# Patient Record
Sex: Male | Born: 1980 | Race: Black or African American | Hispanic: No | Marital: Single | Smoking: Former smoker
Health system: Southern US, Community
[De-identification: ages and names within clinical notes are randomized; demographics above are authoritative.]

## PROBLEM LIST (undated history)

## (undated) DIAGNOSIS — I10 Essential (primary) hypertension: Secondary | ICD-10-CM

---

## 2001-05-01 ENCOUNTER — Emergency Department (HOSPITAL_COMMUNITY): Admission: EM | Admit: 2001-05-01 | Discharge: 2001-05-02 | Payer: Self-pay

## 2001-05-02 ENCOUNTER — Encounter: Payer: Self-pay | Admitting: Emergency Medicine

## 2002-03-24 ENCOUNTER — Emergency Department (HOSPITAL_COMMUNITY): Admission: EM | Admit: 2002-03-24 | Discharge: 2002-03-24 | Payer: Self-pay | Admitting: Emergency Medicine

## 2002-04-03 ENCOUNTER — Emergency Department (HOSPITAL_COMMUNITY): Admission: EM | Admit: 2002-04-03 | Discharge: 2002-04-03 | Payer: Self-pay | Admitting: Emergency Medicine

## 2004-02-05 ENCOUNTER — Emergency Department (HOSPITAL_COMMUNITY): Admission: EM | Admit: 2004-02-05 | Discharge: 2004-02-05 | Payer: Self-pay | Admitting: Emergency Medicine

## 2012-02-10 ENCOUNTER — Ambulatory Visit: Payer: Self-pay | Admitting: Family Medicine

## 2012-02-10 VITALS — BP 174/112 | HR 99 | Temp 99.0°F | Resp 16 | Ht 71.0 in | Wt 204.0 lb

## 2012-02-10 DIAGNOSIS — I10 Essential (primary) hypertension: Secondary | ICD-10-CM

## 2012-02-10 DIAGNOSIS — F1411 Cocaine abuse, in remission: Secondary | ICD-10-CM

## 2012-02-10 MED ORDER — HYDROCHLOROTHIAZIDE 25 MG PO TABS: 25.0000 mg | ORAL_TABLET | Freq: Every day | ORAL | Status: DC

## 2012-02-10 NOTE — Patient Instructions (Addendum)
Very nice to meet you. Please come back Friday morning and have your blood pressure check again with Dr. Neva Seat. We do open up at 7:30 AM. We will be getting labs today and we will discuss this on Friday morning. I'm also going to start you a medicine called hydrochlorothiazide. Take one pill daily If he started getting chest pain, shortness of breath, or any weakness in her extremities please seek medical attention.  Hypertension As your heart beats, it forces blood through your arteries. This force is your blood pressure. If the pressure is too high, it is called hypertension (HTN) or high blood pressure. HTN is dangerous because you may have it and not know it. High blood pressure may mean that your heart has to work harder to pump blood. Your arteries may be narrow or stiff. The extra work puts you at risk for heart disease, stroke, and other problems.  Blood pressure consists of two numbers, a higher number over a lower, 110/72, for example. It is stated as "110 over 72." The ideal is below 120 for the top number (systolic) and under 80 for the bottom (diastolic). Write down your blood pressure today. You should pay close attention to your blood pressure if you have certain conditions such as:  Heart failure.  Prior heart attack.  Diabetes  Chronic kidney disease.  Prior stroke.  Multiple risk factors for heart disease. To see if you have HTN, your blood pressure should be measured while you are seated with your arm held at the level of the heart. It should be measured at least twice. A one-time elevated blood pressure reading (especially in the Emergency Department) does not mean that you need treatment. There may be conditions in which the blood pressure is different between your right and left arms. It is important to see your caregiver soon for a recheck. Most people have essential hypertension which means that there is not a specific cause. This type of high blood pressure may be  lowered by changing lifestyle factors such as:  Stress.  Smoking.  Lack of exercise.  Excessive weight.  Drug/tobacco/alcohol use.  Eating less salt. Most people do not have symptoms from high blood pressure until it has caused damage to the body. Effective treatment can often prevent, delay or reduce that damage. TREATMENT  When a cause has been identified, treatment for high blood pressure is directed at the cause. There are a large number of medications to treat HTN. These fall into several categories, and your caregiver will help you select the medicines that are best for you. Medications may have side effects. You should review side effects with your caregiver. If your blood pressure stays high after you have made lifestyle changes or started on medicines,   Your medication(s) may need to be changed.  Other problems may need to be addressed.  Be certain you understand your prescriptions, and know how and when to take your medicine.  Be sure to follow up with your caregiver within the time frame advised (usually within two weeks) to have your blood pressure rechecked and to review your medications.  If you are taking more than one medicine to lower your blood pressure, make sure you know how and at what times they should be taken. Taking two medicines at the same time can result in blood pressure that is too low. SEEK IMMEDIATE MEDICAL CARE IF:  You develop a severe headache, blurred or changing vision, or confusion.  You have unusual weakness or numbness,  or a faint feeling.  You have severe chest or abdominal pain, vomiting, or breathing problems. MAKE SURE YOU:   Understand these instructions.  Will watch your condition.  Will get help right away if you are not doing well or get worse. Document Released: 12/22/2004 Document Revised: 03/16/2011 Document Reviewed: 08/12/2007 [facility] Patient Information 2013 [place], [place].

## 2012-02-10 NOTE — Progress Notes (Signed)
Patient is a 32 year old male who is coming in with complaint of erectile dysfunction. Patient states that this has been a on and off phenomenon over the course of last year. Patient denies any other recent illnesses and has not been diagnosed with any other trouble. She would like to discuss this further but on intake patient was found to have a blood pressure of 212/120. Patient denies any headache, any visual changes or any abnormal weight loss or gain. Patient has not changed his diet recently. Patient has had some mild recreational drugs with cocaine for greater than 72 hours ago. Patient states that this is not a common f practice. Patient denies any abdominal pain any changes in urination or bowel movements.  Physical exam BP 174/112  Pulse 99  Temp 99 F (37.2 C) (Oral)  Resp 16  Ht 5\' 11"  (1.803 m)  Wt 204 lb (92.534 kg)  BMI 28.45 kg/m2  SpO2 99% General appearance: alert, cooperative and appears stated age Eyes: conjunctivae/corneas clear. PERRL, EOM's intact. Fundi benign. Throat: lips, mucosa, and tongue normal; teeth and gums normal Lungs: clear to auscultation bilaterally Chest wall: no tenderness Heart: regular rate and rhythm, S1, S2 normal, no murmur, click, rub or gallop Abdomen: soft, non-tender; bowel sounds normal; no masses,  no organomegaly Extremities: extremities normal, atraumatic, no cyanosis or edema Pulses: 2+ and symmetric Skin: Skin color, texture, turgor normal. No rashes or lesions Neurologic: Grossly normal  Assessment: Hypertension  Plan: Patient did have a complete metabolic panel drawn today. If this is normal further workup will be necessary including an EKG, TSH, and cortisol levels. Patient will be following up in 48 hours with Dr. Chilton Si for blood pressure check. Patient was placed on hydrochlorothiazide 25 mg today. Patient did understand red flags and when to seek medical attention.

## 2012-02-11 LAB — COMPREHENSIVE METABOLIC PANEL
ALT: <8 U/L (ref 0–53)
AST: 8 U/L (ref 0–37)
Albumin: 4.1 g/dL (ref 3.5–5.2)
Alkaline Phosphatase: 42 U/L (ref 39–117)
BUN: 11 mg/dL (ref 6–23)
CO2: 25 mEq/L (ref 19–32)
Calcium: 9 mg/dL (ref 8.4–10.5)
Chloride: 106 mEq/L (ref 96–112)
Creat: 1.24 mg/dL (ref 0.50–1.35)
Glucose, Bld: 83 mg/dL (ref 70–99)
Potassium: 3.6 mEq/L (ref 3.5–5.3)
Sodium: 139 mEq/L (ref 135–145)
Total Bilirubin: 0.6 mg/dL (ref 0.3–1.2)
Total Protein: 7.5 g/dL (ref 6.0–8.3)

## 2012-02-12 ENCOUNTER — Ambulatory Visit: Payer: Self-pay | Admitting: Family Medicine

## 2012-02-12 VITALS — BP 180/98 | HR 80 | Temp 98.5°F | Resp 16 | Ht 70.0 in | Wt 202.8 lb

## 2012-02-12 DIAGNOSIS — I10 Essential (primary) hypertension: Secondary | ICD-10-CM

## 2012-02-12 DIAGNOSIS — R809 Proteinuria, unspecified: Secondary | ICD-10-CM

## 2012-02-12 DIAGNOSIS — F1410 Cocaine abuse, uncomplicated: Secondary | ICD-10-CM

## 2012-02-12 LAB — POCT URINALYSIS DIPSTICK
Bilirubin, UA: NEGATIVE
Glucose, UA: NEGATIVE
Leukocytes, UA: NEGATIVE
Nitrite, UA: NEGATIVE
Protein, UA: 100
Spec Grav, UA: 1.025
Urobilinogen, UA: 0.2
pH, UA: 5.5

## 2012-02-12 NOTE — Progress Notes (Signed)
Subjective:    Patient ID: Jeffrey Baxter, male    DOB: 09-29-1980, 32 y.o.   MRN: [id number]  HPI  Jeffrey Baxter is a 32 y.o. male Seen by Dr. Katrinka Blazing 2 days ago - found to have a blood pressure of 212/120. Patient denied any headache, any visual changes or any abnormal weight loss or gain. Patient had not changed his diet recently. Patient has had some mild recreational drugs with cocaine for greater than 72 hours prior.   Started on HCTZ 25mg  qd. Labs drawn: Results for orders placed in visit on 02/10/12  COMPREHENSIVE METABOLIC PANEL      Component Value Range   Sodium 139  135 - 145 mEq/L   Potassium 3.6  3.5 - 5.3 mEq/L   Chloride 106  96 - 112 mEq/L   CO2 25  19 - 32 mEq/L   Glucose, Bld 83  70 - 99 mg/dL   BUN 11  6 - 23 mg/dL   Creat 4.09  8.11 - 9.14 mg/dL   Total Bilirubin 0.6  0.3 - 1.2 mg/dL   Alkaline Phosphatase 42  39 - 117 U/L   AST 8  0 - 37 U/L   ALT <8  0 - 53 U/L   Total Protein 7.5  6.0 - 8.3 g/dL   Albumin 4.1  3.5 - 5.2 g/dL   Calcium 9.0  8.4 - 78.2 mg/dL   Has not started blood pressure medicine yet.  Thought was going to get that today. No outside Bp's.  No chest pain. No headache, no vision changes. Urinating normally.  No n/v.  No nosebleeds.   Did admit to cocaine use about 1/2gram 5 days ago during the [employer].  Uses cocaine 1-2 times per month. Plans on quitting at this point.  Does not feel like he needs assistance to quit.   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 Rare marijuana, cocaine as above. Denies other IDU.  Etoh: 1-2 beers per night during the week. 4-5 at a time on the weekend. DUI in 2003.   Quit tobacco 3 months ago.   FH - dad with htn.   Review of Systems  Constitutional: Negative for fatigue and unexpected weight change.  Eyes: Negative for visual disturbance.  Respiratory: Negative for cough, chest tightness and shortness of breath.   Cardiovascular: Negative for chest pain, palpitations and leg swelling.   Gastrointestinal: Negative for abdominal pain and blood in stool.  Genitourinary: Negative for difficulty urinating.  Neurological: Negative for dizziness, weakness, light-headedness and headaches.       Objective:   Physical Exam  Vitals reviewed. Constitutional: He is oriented to person, place, and time. He appears well-developed and well-nourished.  HENT:  Head: Normocephalic and atraumatic.  Eyes: EOM are normal. Pupils are equal, round, and reactive to light.  Neck: No JVD present. Carotid bruit is not present.  Cardiovascular: Normal rate, regular rhythm and normal heart sounds.   No murmur heard. Pulmonary/Chest: Effort normal and breath sounds normal. He has no rales.  Musculoskeletal: He exhibits no edema.  Neurological: He is alert and oriented to person, place, and time.  Skin: Skin is warm and dry.  Psychiatric: He has a normal mood and affect.   Results for orders placed in visit on 02/12/12  POCT URINALYSIS DIPSTICK      Component Value Range   Color, UA amber     Clarity, UA clear     Glucose, UA neg  Bilirubin, UA neg     Ketones, UA trace     Spec Grav, UA 1.025     Blood, UA trace     pH, UA 5.5     Protein, UA 100     Urobilinogen, UA 0.2     Nitrite, UA neg     Leukocytes, UA Negative        Assessment & Plan:  Jeffrey Baxter is a 32 y.o. male  1. Hypertension  POCT urinalysis dipstick, Ambulatory referral to Nephrology  2. Cocaine abuse  POCT urinalysis dipstick  3. Proteinuria  Ambulatory referral to Nephrology   Marked HTN, asx.  liklely in part due to prior cocaine use, but off this for 5 days, and plans on complete abstinence. Proteinuric - will refer to nephrology for eval. Can start hctz as rx last ov, and keep record of outside bp's.  Recheck 1 week, rtc/ER and orthostatic precautions reviewed.   Patient Instructions  Complete abstinence/avoidance of cocaine and minimize alcohol use - no more than 1 to 2 drinks per day.  Start the  blood pressure medicine as prescribed last office visit, and recheck in next week to 10 days.  Return to the clinic or go to the nearest emergency room if any of your symptoms worsen or new symptoms occur. Keep a record of your blood pressures outside of the office and bring them to the next office visit. We will also refer you to a kidney specialist to determine if further testing needed due to the level of your high blood pressure and the protein in the urine.

## 2012-02-12 NOTE — Patient Instructions (Addendum)
Complete abstinence/avoidance of cocaine and minimize alcohol use - no more than 1 to 2 drinks per day.  Start the blood pressure medicine as prescribed last office visit, and recheck in next week to 10 days.  Return to the clinic or go to the nearest emergency room if any of your symptoms worsen or new symptoms occur. Keep a record of your blood pressures outside of the office and bring them to the next office visit. We will also refer you to a kidney specialist to determine if further testing needed due to the level of your high blood pressure and the protein in the urine.

## 2012-02-12 NOTE — Progress Notes (Signed)
No available appts on the 17th. Per provider I called pt and he will walk in to see Dr. Neva Seat on 2/19 after work.

## 2012-02-24 ENCOUNTER — Ambulatory Visit: Payer: Self-pay | Admitting: Family Medicine

## 2012-02-24 VITALS — BP 152/88 | HR 90 | Temp 98.7°F | Resp 16 | Ht 70.0 in | Wt 197.0 lb

## 2012-02-24 DIAGNOSIS — I10 Essential (primary) hypertension: Secondary | ICD-10-CM

## 2012-02-24 DIAGNOSIS — R809 Proteinuria, unspecified: Secondary | ICD-10-CM

## 2012-02-24 MED ORDER — HYDROCHLOROTHIAZIDE 25 MG PO TABS: 25.0000 mg | ORAL_TABLET | Freq: Every day | ORAL | Status: DC

## 2012-02-24 NOTE — Progress Notes (Signed)
  Subjective:    Patient ID: Jeffrey Baxter, male    DOB: Nov 11, 1980, 32 y.o.   MRN: [id number]  HPI Jeffrey Baxter is a 32 y.o. male  See prior ov's - recent diagnosis of HTN, hx of caocaine abuse. And proteinuria noted at last ov.  Just started hctz 25mg  wd at last ov. Referred to nephrology.   Taking hctz each day. Only 1 missed dose since 02/12/12 ov. Home reading down to 125/80.  Has been exercising some. Cr wnl (1.24) at initial ov.  No further cocaine use- no cravings or addiction prior.   Results for orders placed in visit on 02/12/12  POCT URINALYSIS DIPSTICK      Result Value Range   Color, UA amber     Clarity, UA clear     Glucose, UA neg     Bilirubin, UA neg     Ketones, UA trace     Spec Grav, UA 1.025     Blood, UA trace     pH, UA 5.5     Protein, UA 100     Urobilinogen, UA 0.2     Nitrite, UA neg     Leukocytes, UA Negative       Review of Systems  Constitutional: Negative for fatigue and unexpected weight change.  Eyes: Negative for visual disturbance.  Respiratory: Negative for cough (slight coough last week with URI. no PND, no DOE, no orthopnea. ), chest tightness and shortness of breath.   Cardiovascular: Negative for chest pain, palpitations and leg swelling.  Gastrointestinal: Negative for abdominal pain and blood in stool.  Neurological: Negative for dizziness, light-headedness and headaches.       Objective:   Physical Exam  Vitals reviewed. Constitutional: He is oriented to person, place, and time. He appears well-developed and well-nourished.  HENT:  Head: Normocephalic and atraumatic.  Eyes: EOM are normal. Pupils are equal, round, and reactive to light.  Neck: No JVD present. Carotid bruit is not present.  Cardiovascular: Normal rate, regular rhythm and normal heart sounds.   No murmur heard. Pulmonary/Chest: Effort normal and breath sounds normal. He has no rales.  Musculoskeletal: He exhibits no edema.  Neurological: He is alert and  oriented to person, place, and time.  Skin: Skin is warm and dry.  Psychiatric: He has a normal mood and affect.          Assessment & Plan:  Jeffrey Baxter is a 32 y.o. male HTN (hypertension) - Plan: hydrochlorothiazide (HYDRODIURIL) 25 MG tablet - continue same dose, and recheck in next 6 weeks. Congratulated on efforts with diet and exercise.  Discussed ED meds, but as only on meds few weeks, and risk of hypotension - will wait for few more weeks of being on med to decide on safety of using this medicine.   Proteinuria - will be scheduled with nephrology in 6-8 weeks.   Patient Instructions  Continue your same dose of medicine and diet changes.  Keep up the good work. Recheck in 6 weeks. Keep a record of your blood pressures outside of the office and bring them to the next office visit. Return to the clinic or go to the nearest emergency room if any of your symptoms worsen or new symptoms occur.

## 2012-02-24 NOTE — Patient Instructions (Signed)
Continue your same dose of medicine and diet changes.  Keep up the good work. Recheck in 6 weeks. Keep a record of your blood pressures outside of the office and bring them to the next office visit. Return to the clinic or go to the nearest emergency room if any of your symptoms worsen or new symptoms occur.

## 2012-04-05 ENCOUNTER — Ambulatory Visit: Payer: Self-pay | Admitting: Family Medicine

## 2012-04-05 VITALS — BP 128/72 | HR 79 | Temp 98.8°F | Resp 18 | Wt 203.0 lb

## 2012-04-05 DIAGNOSIS — N529 Male erectile dysfunction, unspecified: Secondary | ICD-10-CM

## 2012-04-05 DIAGNOSIS — I10 Essential (primary) hypertension: Secondary | ICD-10-CM

## 2012-04-05 MED ORDER — SILDENAFIL CITRATE 100 MG PO TABS: 50.0000 mg | ORAL_TABLET | Freq: Every day | ORAL | Status: DC | PRN

## 2012-04-05 MED ORDER — HYDROCHLOROTHIAZIDE 25 MG PO TABS: 25.0000 mg | ORAL_TABLET | Freq: Every day | ORAL | Status: DC

## 2012-04-05 NOTE — Patient Instructions (Addendum)
recheck in next 3 months for blood pressure and to check testosterone level (morning lab testing is best for this). Return to the clinic or go to the nearest emergency room if any of your symptoms worsen or new symptoms occur.

## 2012-04-05 NOTE — Progress Notes (Signed)
  Subjective:    Patient ID: Jeffrey Baxter, male    DOB: [birth year], 32 y.o.   MRN: [id number]  HPI Jeffrey Baxter is a 32 y.o. male  Hx of HTN - see prior ov's.  No further use of cocaine.  Has not used since initial OV.  Taking hctz 25mg  qd. No missed doses, no new side effects. Works better if taking in afternoon. Home bp's 120-130/70-85. Urinating normally.  Hx of proteniuria - Has not had appt scheduled with neprology yet.   ED - still with some difficulty in obtaining erection. Partial erection but difficulty with full erection. Tried staxyn 10mg  pill twice (brother's rx) - worked well, no side effect - no ha/flushing.    Review of Systems  Constitutional: Negative for fatigue and unexpected weight change.  Eyes: Negative for visual disturbance.  Respiratory: Negative for cough, chest tightness and shortness of breath.   Cardiovascular: Negative for chest pain, palpitations and leg swelling.  Gastrointestinal: Negative for abdominal pain and blood in stool.  Neurological: Negative for dizziness, light-headedness and headaches.       Objective:   Physical Exam  Vitals reviewed. Constitutional: He is oriented to person, place, and time. He appears well-developed and well-nourished.  HENT:  Head: Normocephalic and atraumatic.  Eyes: EOM are normal. Pupils are equal, round, and reactive to light.  Neck: No JVD present. Carotid bruit is not present.  Cardiovascular: Normal rate, regular rhythm and normal heart sounds.   No murmur heard. Pulmonary/Chest: Effort normal and breath sounds normal. He has no rales.  Musculoskeletal: He exhibits no edema.  Neurological: He is alert and oriented to person, place, and time.  Skin: Skin is warm and dry.  Psychiatric: He has a normal mood and affect.       Assessment & Plan:  Jeffrey Baxter is a 32 y.o. male Erectile dysfunction - Plan: sildenafil (VIAGRA) 100 MG tablet. - #3, 3rf - 1/2 to 1 prn. Sed, and chest pain precautions  reviewed. Plan on testosterone level with other labs in next few months.   HTN (hypertension) -stable.  Plan: continue hydrochlorothiazide (HYDRODIURIL) 25 MG tablet. recheck 3 months,  Meds ordered this encounter  Medications  . sildenafil (VIAGRA) 100 MG tablet    Sig: Take 0.5-1 tablets (50-100 mg total) by mouth daily as needed for erectile dysfunction.    Dispense:  3 tablet    Refill:  3  . hydrochlorothiazide (HYDRODIURIL) 25 MG tablet    Sig: Take 1 tablet (25 mg total) by mouth daily.    Dispense:  90 tablet    Refill:  0     Patient Instructions  recheck in next 3 months for blood pressure and to check testosterone level (morning lab testing is best for this). Return to the clinic or go to the nearest emergency room if any of your symptoms worsen or new symptoms occur.

## 2012-12-02 ENCOUNTER — Emergency Department (HOSPITAL_COMMUNITY)
Admission: EM | Admit: 2012-12-02 | Discharge: 2012-12-02 | Disposition: A | Discharge status: Home / Self Care | Payer: Self-pay | Attending: Emergency Medicine | Admitting: Emergency Medicine

## 2012-12-02 ENCOUNTER — Encounter (HOSPITAL_COMMUNITY): Payer: Self-pay | Admitting: Emergency Medicine

## 2012-12-02 ENCOUNTER — Emergency Department (HOSPITAL_COMMUNITY): Payer: Self-pay

## 2012-12-02 DIAGNOSIS — Z79899 Other long term (current) drug therapy: Secondary | ICD-10-CM | POA: Insufficient documentation

## 2012-12-02 DIAGNOSIS — Z87891 Personal history of nicotine dependence: Secondary | ICD-10-CM | POA: Insufficient documentation

## 2012-12-02 DIAGNOSIS — I10 Essential (primary) hypertension: Secondary | ICD-10-CM | POA: Insufficient documentation

## 2012-12-02 DIAGNOSIS — J069 Acute upper respiratory infection, unspecified: Secondary | ICD-10-CM | POA: Insufficient documentation

## 2012-12-02 HISTORY — DX: Essential (primary) hypertension: I10

## 2012-12-02 MED ORDER — PROMETHAZINE-CODEINE 6.25-10 MG/5ML PO SYRP: 5.0000 mL | ORAL_SOLUTION | Freq: Four times a day (QID) | ORAL | Status: AC | PRN

## 2012-12-02 NOTE — ED Provider Notes (Signed)
Medical screening examination/treatment/procedure(s) were performed by non-physician practitioner and as supervising physician I was immediately available for consultation/collaboration.  Zamiah Tollett L Jahyra Sukup, MD 12/02/12 1700 

## 2012-12-02 NOTE — ED Notes (Signed)
Pt reports a sore throat, productive cough with clear colored sputum, nasal and chest congestion starting Tuesday.

## 2012-12-02 NOTE — ED Provider Notes (Signed)
CSN: [id number]     Arrival date & time 12/02/12  4098 History  This chart was scribed for non-physician practitioner Sharilyn Sites, PA-C working with Flint Melter, MD by Danella Maiers, ED Scribe. This patient was seen in room TR07C/TR07C and the patient's care was started at 10:28 AM.   Chief Complaint  Patient presents with  . Sore Throat  . Nasal Congestion   The history is provided by the patient. No language interpreter was used.   HPI Comments: Jeffrey Baxter is a 32 y.o. male who presents to the Emergency Department complaining of sore throat and productive cough with clear sputum for the past 3 days. He also reports mild nasal congestion and chest congestion for the past 2 days. He states his voice has started sounding hoarse. He denies sick contacts. He denies fevers, sweats, or chills.  No difficulty swallowing.   Past Medical History  Diagnosis Date  . Hypertension    History reviewed. No pertinent past surgical history. Family History  Problem Relation Age of Onset  . Diabetes Father   . Diabetes Brother   . Diabetes Brother    History  Substance Use Topics  . Smoking status: Former Games developer  . Smokeless tobacco: Not on file  . Alcohol Use: Yes    Review of Systems  Constitutional: Negative for fever and chills.  HENT: Positive for congestion and sore throat.   Respiratory: Positive for cough.   All other systems reviewed and are negative.    Allergies  Review of patient's allergies indicates no known allergies.  Home Medications   Current Outpatient Rx  Name  Route  Sig  Dispense  Refill  . hydrochlorothiazide (HYDRODIURIL) 25 MG tablet   Oral   Take 1 tablet (25 mg total) by mouth daily.   90 tablet   0   . sildenafil (VIAGRA) 100 MG tablet   Oral   Take 0.5-1 tablets (50-100 mg total) by mouth daily as needed for erectile dysfunction.   3 tablet   3    BP 145/85  Pulse 80  Temp(Src) 97.9 F (36.6 C) (Oral)  Resp 18  Ht 5\' 11"  (1.803 m)   Wt 205 lb (92.987 kg)  BMI 28.60 kg/m2  SpO2 100%  Physical Exam  Nursing note and vitals reviewed. Constitutional: He is oriented to person, place, and time. He appears well-developed and well-nourished. No distress.  HENT:  Head: Normocephalic and atraumatic.  Right Ear: Tympanic membrane and ear canal normal.  Left Ear: Tympanic membrane and ear canal normal.  Nose: Nose normal.  Mouth/Throat: Uvula is midline, oropharynx is clear and moist and mucous membranes are normal. No oropharyngeal exudate, posterior oropharyngeal edema, posterior oropharyngeal erythema or tonsillar abscesses.  PND noted in oropharynx; tonsils normal in appearance bilaterally without exudate; uvula midline, no PTA; handling secretions appropriately; no difficulty swallowing or speaking  Eyes: Conjunctivae and EOM are normal. Pupils are equal, round, and reactive to light.  Neck: Normal range of motion. Neck supple. No tracheal deviation present.  Cardiovascular: Normal rate.   Pulmonary/Chest: Effort normal. No respiratory distress.  Coarse breath sounds in left lung base  Musculoskeletal: Normal range of motion.  Neurological: He is alert and oriented to person, place, and time.  Skin: Skin is warm and dry. He is not diaphoretic.  Psychiatric: He has a normal mood and affect. His behavior is normal.    ED Course  Procedures (including critical care time) Medications - No data to display  DIAGNOSTIC  STUDIES: Oxygen Saturation is 100% on RA, normal by my interpretation.    COORDINATION OF CARE: 10:32 AM- Discussed treatment plan with pt which includes CXR. Pt agrees to plan.    Labs Review Labs Reviewed - No data to display Imaging Review Dg Chest 2 View  12/02/2012   CLINICAL DATA:  Sore throat, nasal and chest congestion, and cough.  EXAM: CHEST  2 VIEW  COMPARISON:  None.  FINDINGS: Cardiac silhouette is enlarged. Mild bibasilar opacities seen on the lateral view, likely atelectasis. No edema.  No pleural effusion or pneumothorax. No acute osseous abnormality.  IMPRESSION: 1. Mild bibasilar opacities, likely atelectasis.  2.  Enlarged cardiac silhouette.   Electronically Signed   By: Jerene Dilling M.D.   On: 12/02/2012 11:06    EKG Interpretation   None       MDM   1. Viral URI with cough    PND noted in oropharynx, tonsils normal in appearance, no cervical lymphadenopathy-- low suspicion for strep pharyngitis.  CXR clear.  Pt likely with viral URI.  Rx cough syrup.  May FU with [facility] as needed.  Discussed plan with pt, he agreed.  Return precautions advised.  I personally performed the services described in this documentation, which was scribed in my presence. The recorded information has been reviewed and is accurate.  Garlon Hatchet, PA-C 12/02/12 1119

## 2013-03-21 ENCOUNTER — Other Ambulatory Visit: Payer: Self-pay | Admitting: Family Medicine

## 2013-03-21 DIAGNOSIS — N529 Male erectile dysfunction, unspecified: Secondary | ICD-10-CM

## 2013-03-21 DIAGNOSIS — I10 Essential (primary) hypertension: Secondary | ICD-10-CM

## 2013-03-21 MED ORDER — HYDROCHLOROTHIAZIDE 25 MG PO TABS: 25.0000 mg | ORAL_TABLET | Freq: Every day | ORAL | Status: AC

## 2013-03-21 NOTE — Telephone Encounter (Signed)
Dr Neva SeatGreene, I just gave pt 1 mos RF of HCTZ w/note to RTC for add'l RFs. Pended the Cialis the same way for your review.

## 2013-03-21 NOTE — Telephone Encounter (Signed)
Done

## 2014-11-14 IMAGING — CR DG CHEST 2V
2 series · 2 of 2 positions shown · non-contrast
Comparison: None.

CLINICAL DATA: Sore throat, nasal and chest congestion, and cough.

EXAM:
CHEST  2 VIEW

[w chest pa]
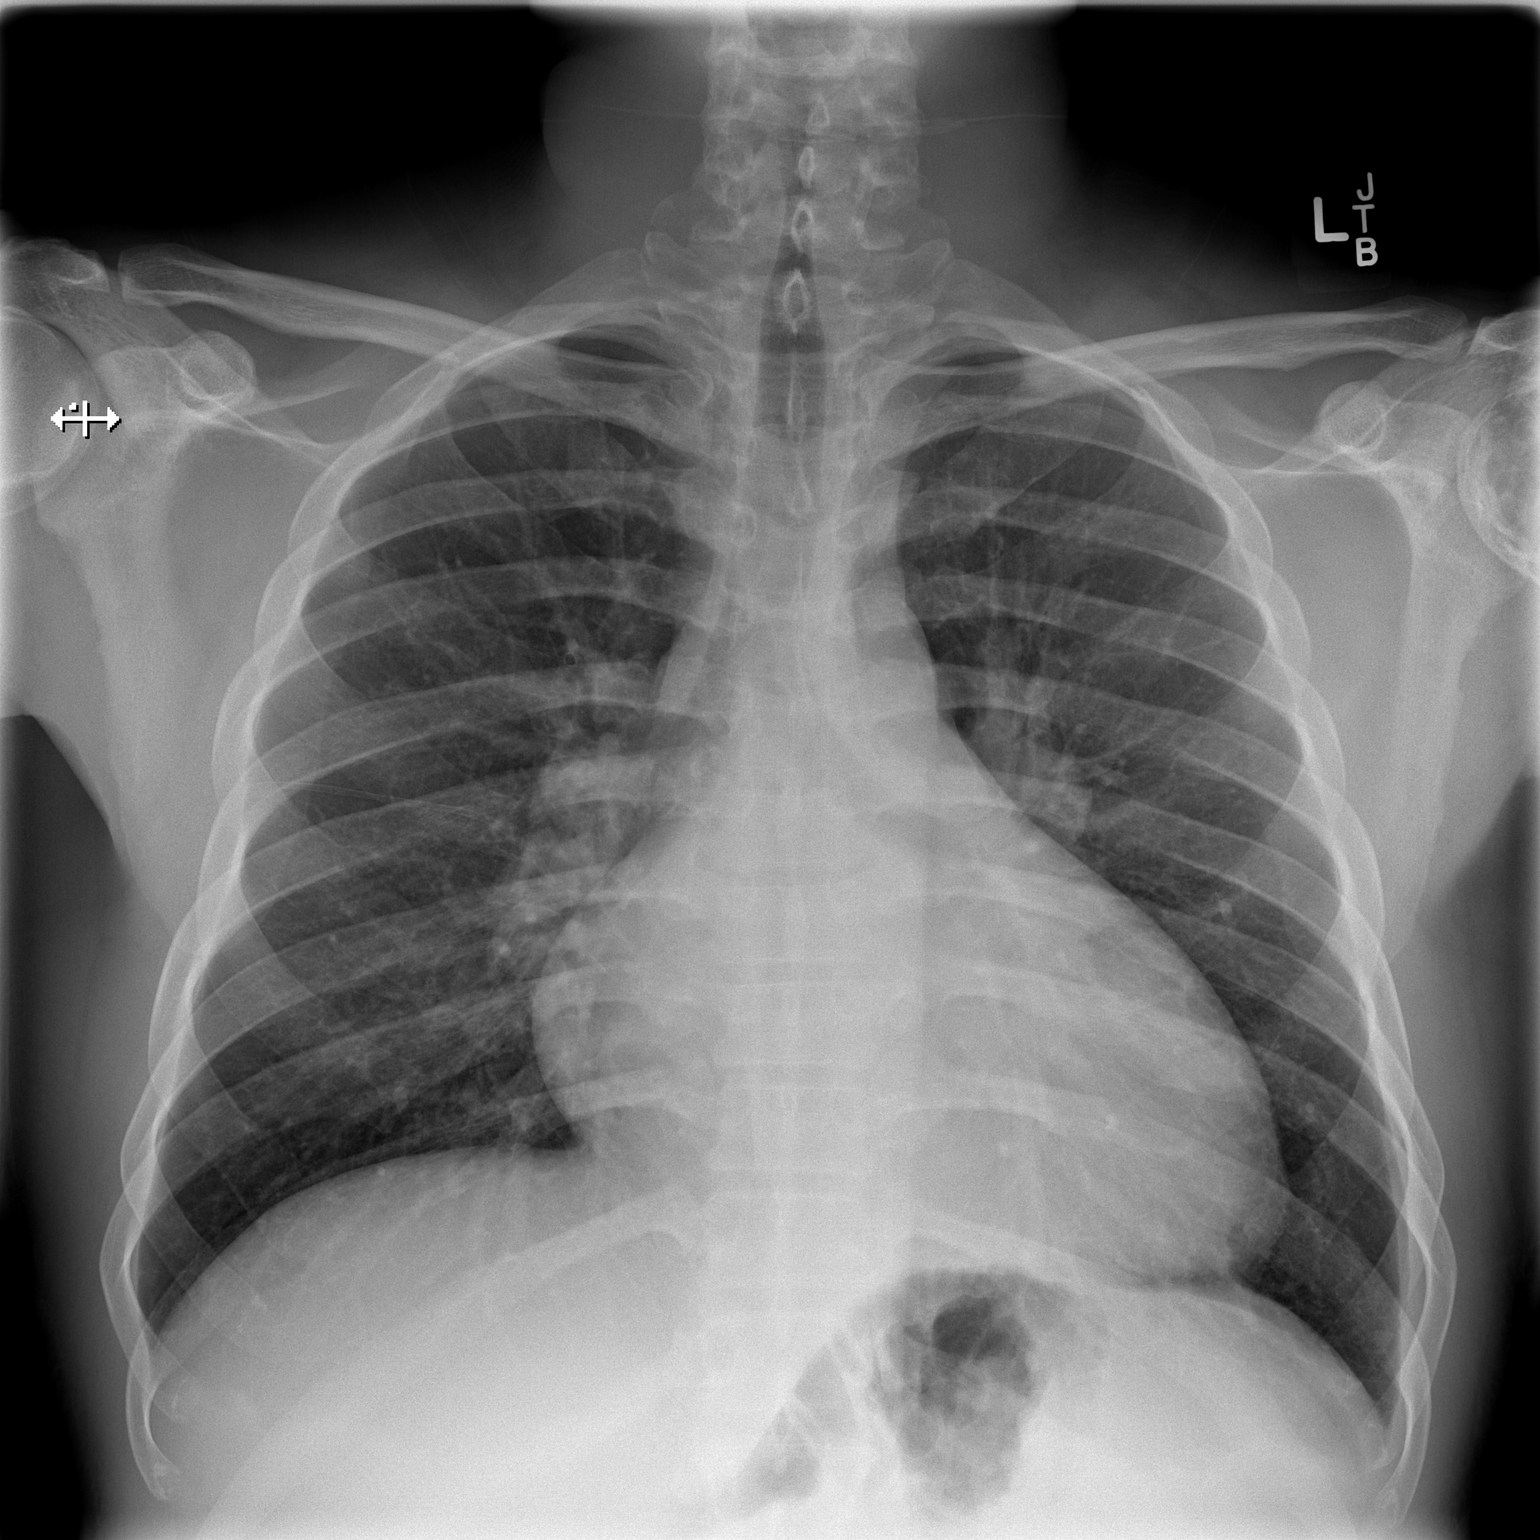

[w chest lat]
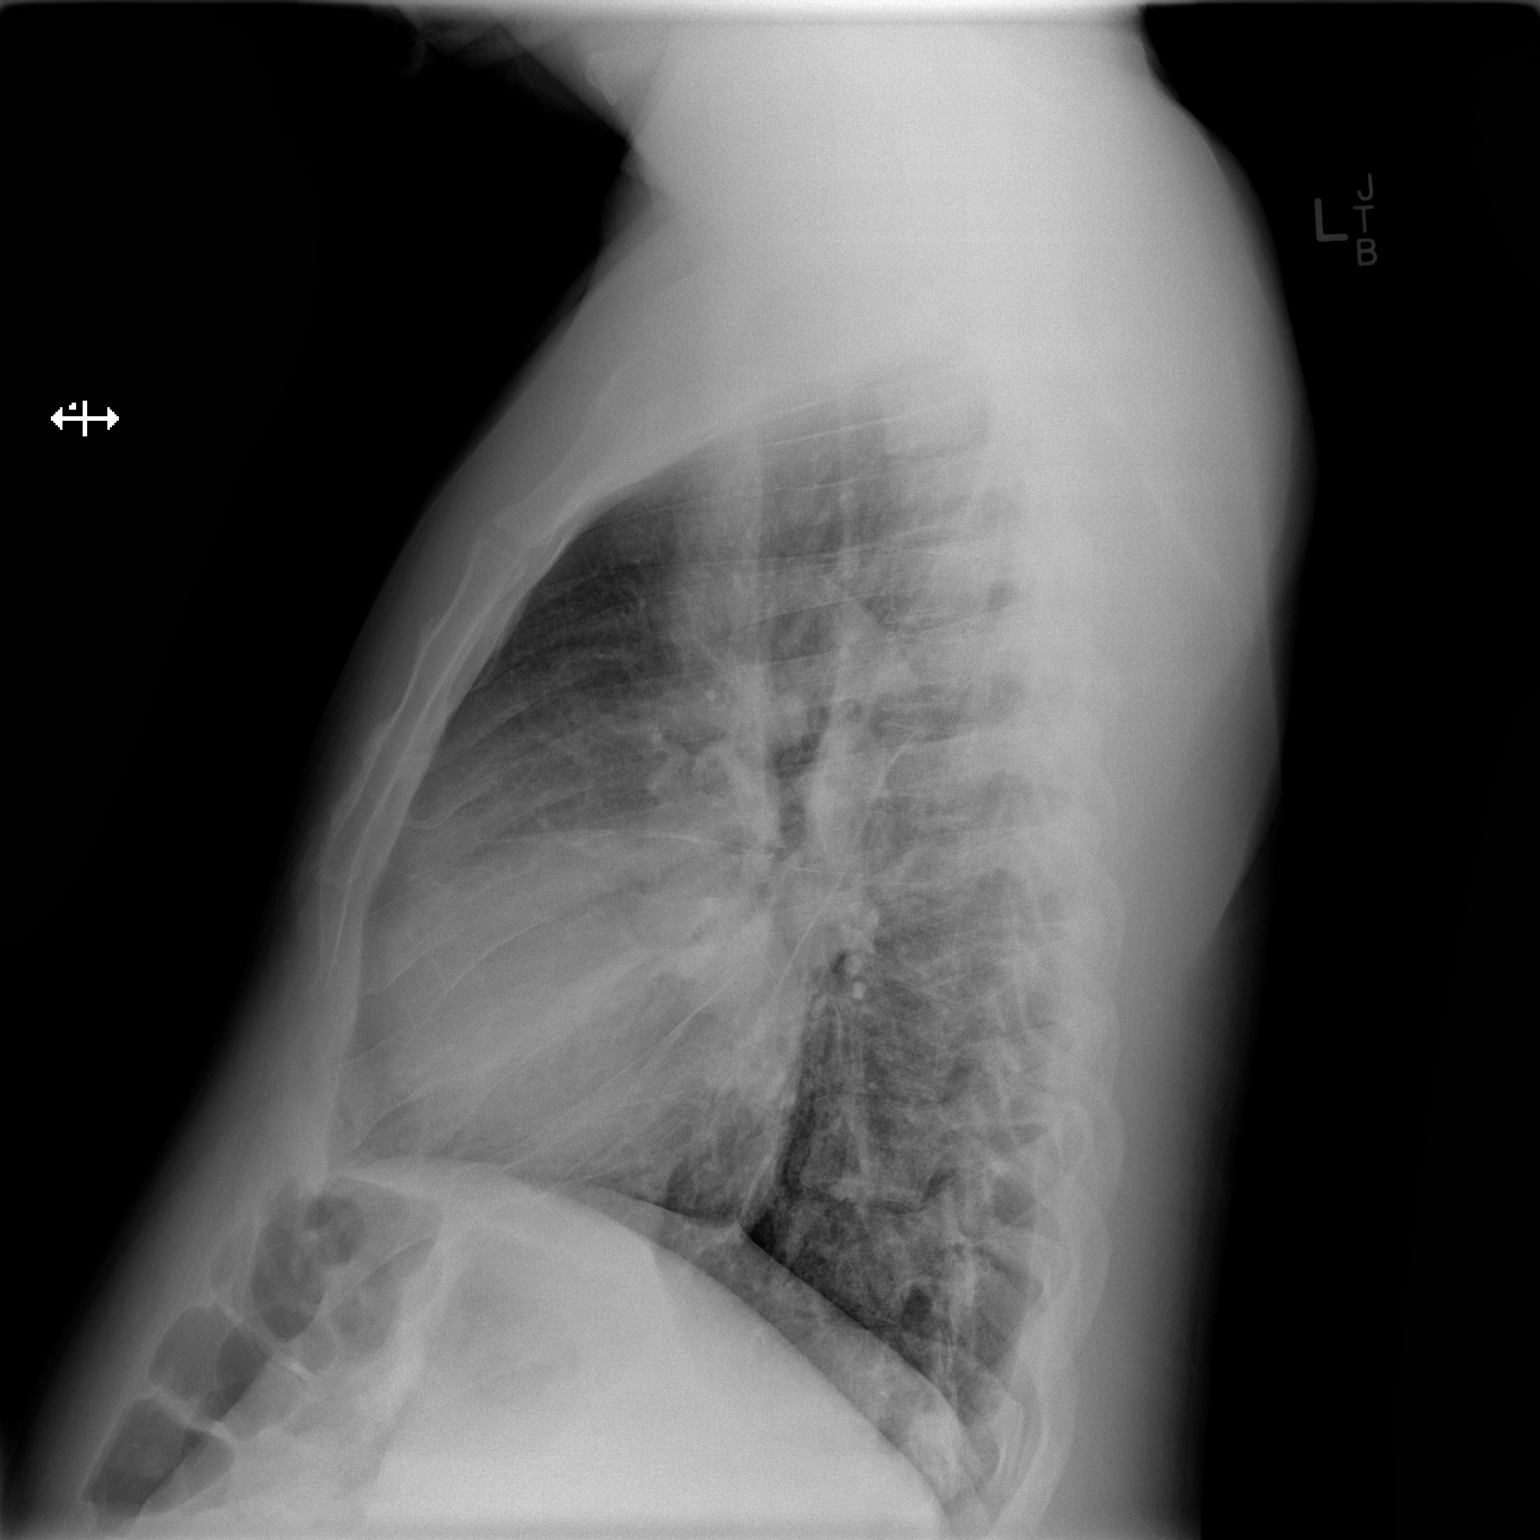

[2 of 2 positions shown; findings below may reference images not displayed]

FINDINGS: Cardiac silhouette is enlarged. Mild bibasilar opacities seen on the
lateral view, likely atelectasis. No edema. No pleural effusion or
pneumothorax. No acute osseous abnormality.
IMPRESSION: 1. Mild bibasilar opacities, likely atelectasis.

2.  Enlarged cardiac silhouette.
